# Patient Record
Sex: Female | Born: 1975 | Hispanic: Yes | Marital: Married | Smoking: Former smoker
Health system: Southern US, Community
[De-identification: ages and names within clinical notes are randomized; demographics above are authoritative.]

---

## 2006-05-10 ENCOUNTER — Encounter: Admission: RE | Admit: 2006-05-10 | Discharge: 2006-05-10 | Payer: Self-pay | Admitting: Family Medicine

## 2007-02-08 ENCOUNTER — Encounter: Admission: RE | Admit: 2007-02-08 | Discharge: 2007-02-08 | Payer: Self-pay | Admitting: Family Medicine

## 2010-03-23 ENCOUNTER — Ambulatory Visit (HOSPITAL_COMMUNITY)
Admission: RE | Admit: 2010-03-23 | Discharge: 2010-03-23 | Payer: Self-pay | Source: Home / Self Care | Admitting: Obstetrics and Gynecology

## 2010-09-25 ENCOUNTER — Encounter: Payer: Self-pay | Admitting: Family Medicine

## 2011-10-18 ENCOUNTER — Other Ambulatory Visit (HOSPITAL_COMMUNITY): Payer: Self-pay | Admitting: Obstetrics and Gynecology

## 2011-10-18 DIAGNOSIS — N736 Female pelvic peritoneal adhesions (postinfective): Secondary | ICD-10-CM

## 2011-10-24 ENCOUNTER — Ambulatory Visit (HOSPITAL_COMMUNITY)
Admission: RE | Admit: 2011-10-24 | Discharge: 2011-10-24 | Disposition: A | Discharge status: Home / Self Care | Payer: PRIVATE HEALTH INSURANCE | Source: Ambulatory Visit | Attending: Obstetrics and Gynecology | Admitting: Obstetrics and Gynecology

## 2011-10-24 DIAGNOSIS — N736 Female pelvic peritoneal adhesions (postinfective): Secondary | ICD-10-CM

## 2011-10-24 DIAGNOSIS — N979 Female infertility, unspecified: Secondary | ICD-10-CM | POA: Insufficient documentation

## 2011-10-24 MED ORDER — IOHEXOL 300 MG/ML  SOLN
12.0000 mL | Freq: Once | INTRAMUSCULAR | Status: AC | PRN
  Administered 2011-10-24: 12 mL

## 2012-11-01 IMAGING — RF DG HYSTEROGRAM
3 series · 6 of 6 positions shown · non-contrast
Comparison: none

CLINICAL DATA: Infertility, status post laparoscopic lysis of
adhesions

HYSTEROSALPINGOGRAM
TECHNIQUE: Hysterosalpingogram was performed by the ordering
physician under fluoroscopy.  Fluoroscopic images are submitted for
interpretation following the procedure.
Fluoroscopy Time:  0.7 minutes.

[Series 1: run · 2 of 2 slices shown (1 of 3)]
[im 1/2]
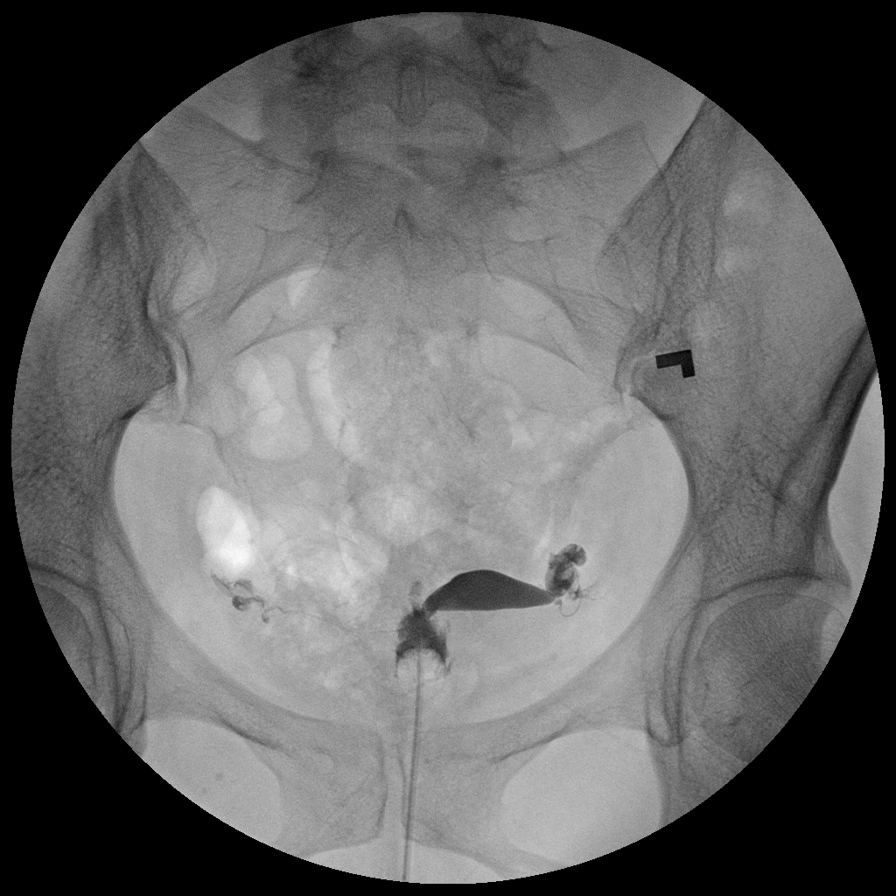
[im 2/2]
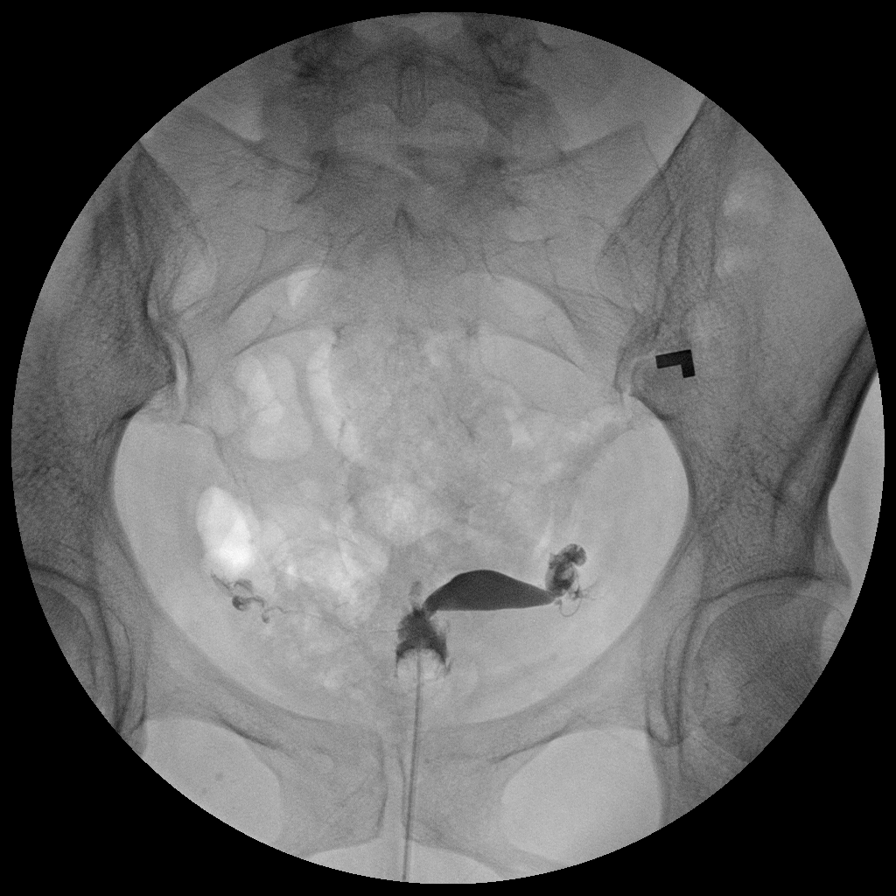

[Series 2: run · 2 of 2 slices shown (2 of 3)]
[im 1/2]
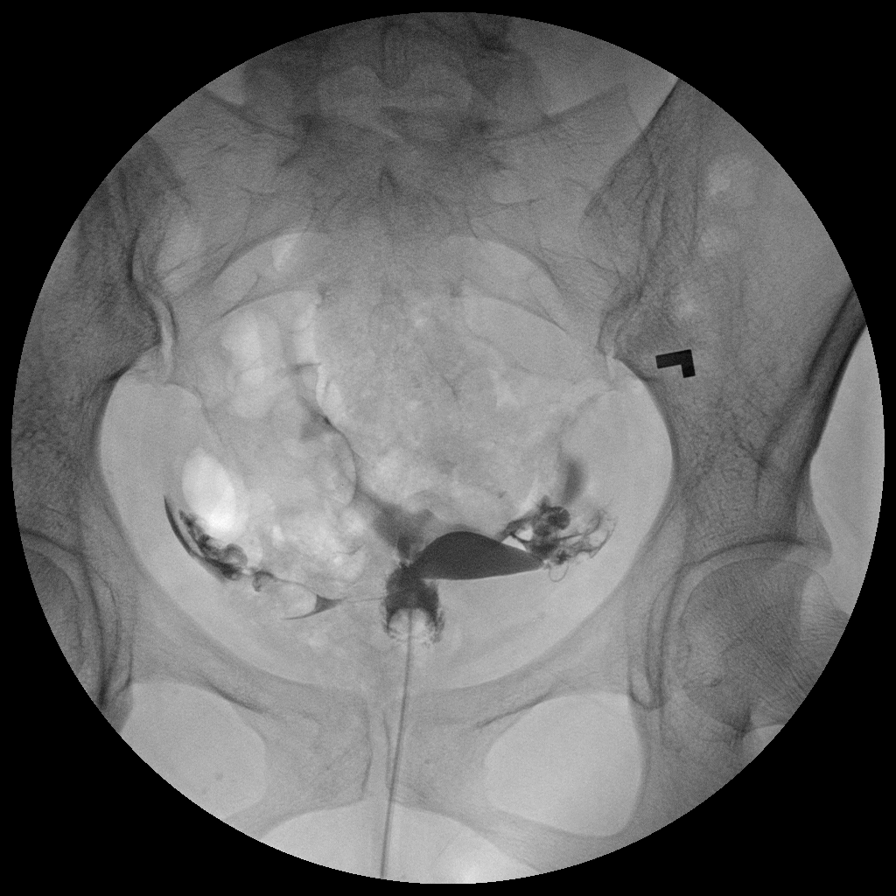
[im 2/2]
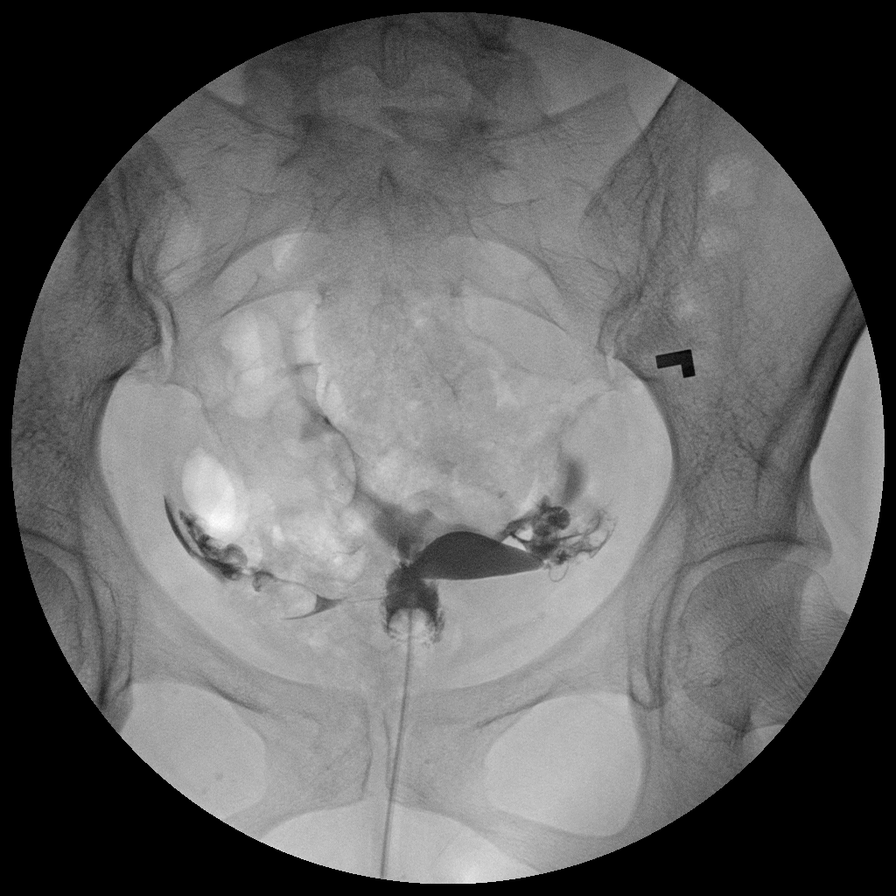

[Series 3: run · 2 of 2 slices shown (3 of 3)]
[im 1/2]
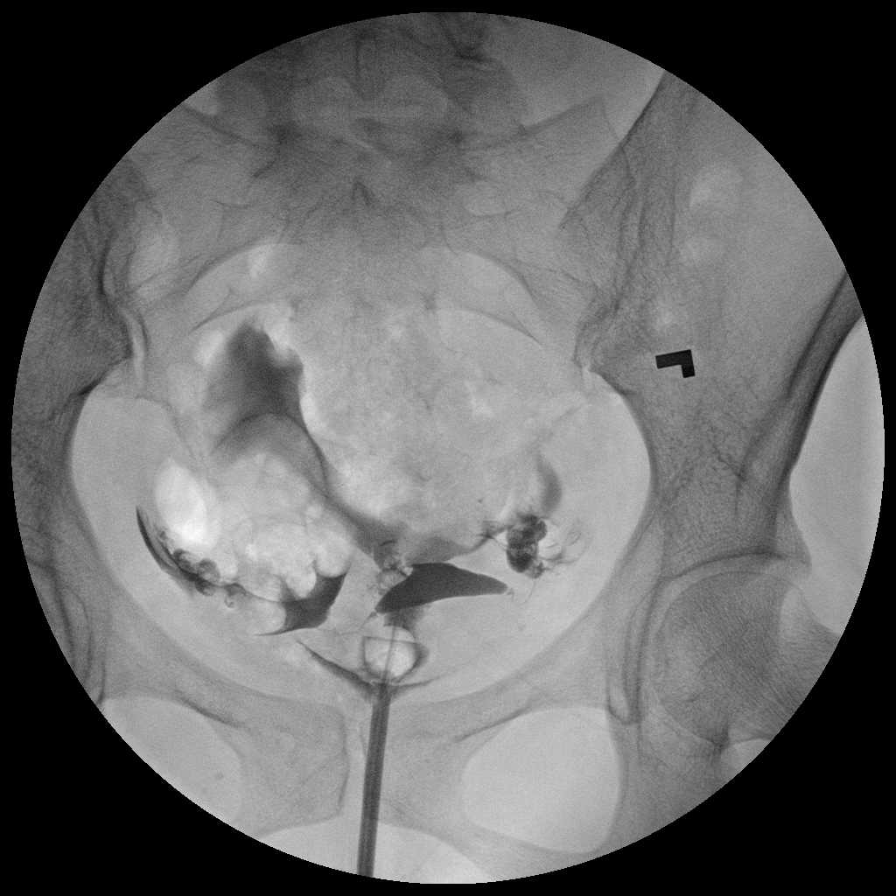
[im 2/2]
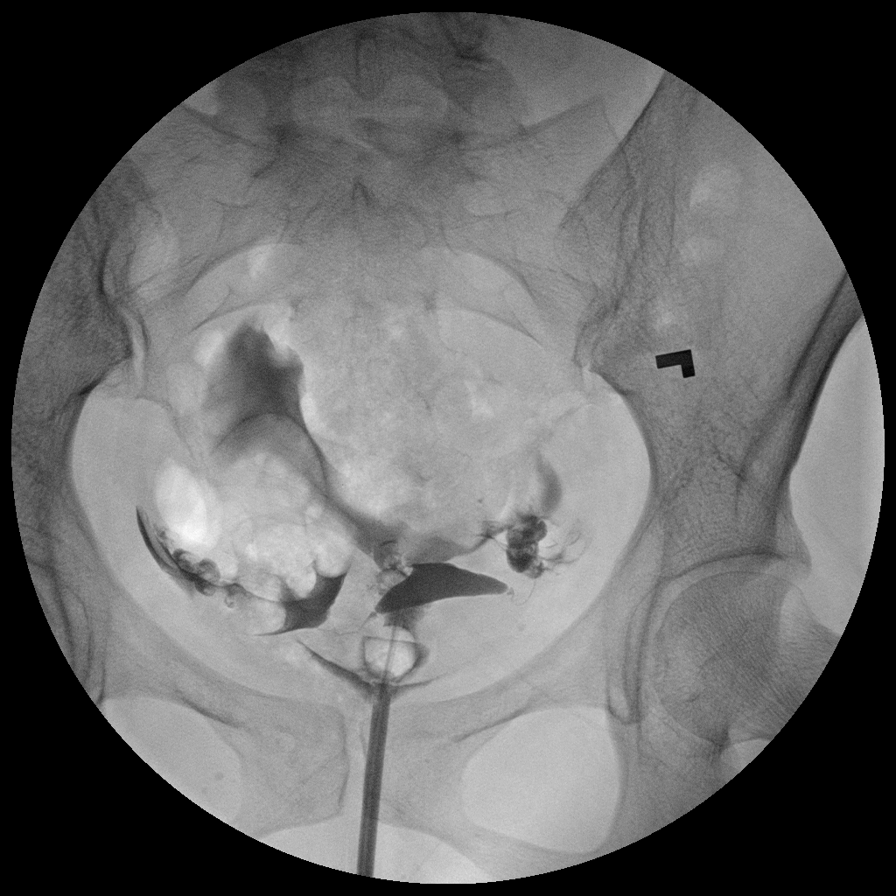

[6 of 6 positions shown; findings below may reference images not displayed]

FINDINGS: The endometrial cavity of the uterus is normal in contour
and appearance.

Contrast filling of both fallopian tubes is seen, and both tubes
are normal in appearance.  Intraperitoneal spill of the contrast
from both fallopian tubes is demonstrated.
IMPRESSION: Normal study.  Fallopian tubes are patent bilaterally.

## 2016-08-26 ENCOUNTER — Inpatient Hospital Stay (HOSPITAL_COMMUNITY): Payer: PRIVATE HEALTH INSURANCE

## 2016-08-26 ENCOUNTER — Encounter (HOSPITAL_COMMUNITY): Payer: Self-pay

## 2016-08-26 ENCOUNTER — Inpatient Hospital Stay (HOSPITAL_COMMUNITY)
Admission: AD | Admit: 2016-08-26 | Discharge: 2016-08-26 | Disposition: A | Discharge status: Home / Self Care | Payer: PRIVATE HEALTH INSURANCE | Source: Ambulatory Visit | Attending: Obstetrics and Gynecology | Admitting: Obstetrics and Gynecology

## 2016-08-26 DIAGNOSIS — Z7982 Long term (current) use of aspirin: Secondary | ICD-10-CM | POA: Diagnosis not present

## 2016-08-26 DIAGNOSIS — R109 Unspecified abdominal pain: Secondary | ICD-10-CM

## 2016-08-26 DIAGNOSIS — O021 Missed abortion: Secondary | ICD-10-CM | POA: Diagnosis not present

## 2016-08-26 DIAGNOSIS — Z3A01 Less than 8 weeks gestation of pregnancy: Secondary | ICD-10-CM | POA: Insufficient documentation

## 2016-08-26 DIAGNOSIS — D6861 Antiphospholipid syndrome: Secondary | ICD-10-CM | POA: Diagnosis not present

## 2016-08-26 DIAGNOSIS — O26899 Other specified pregnancy related conditions, unspecified trimester: Secondary | ICD-10-CM

## 2016-08-26 DIAGNOSIS — O99111 Other diseases of the blood and blood-forming organs and certain disorders involving the immune mechanism complicating pregnancy, first trimester: Secondary | ICD-10-CM | POA: Insufficient documentation

## 2016-08-26 DIAGNOSIS — R1031 Right lower quadrant pain: Secondary | ICD-10-CM | POA: Diagnosis present

## 2016-08-26 DIAGNOSIS — Z87891 Personal history of nicotine dependence: Secondary | ICD-10-CM | POA: Insufficient documentation

## 2016-08-26 DIAGNOSIS — Z7901 Long term (current) use of anticoagulants: Secondary | ICD-10-CM | POA: Insufficient documentation

## 2016-08-26 LAB — URINALYSIS, ROUTINE W REFLEX MICROSCOPIC
BILIRUBIN URINE: NEGATIVE
Bacteria, UA: NONE SEEN
Glucose, UA: NEGATIVE mg/dL
HGB URINE DIPSTICK: NEGATIVE
Ketones, ur: NEGATIVE mg/dL
Nitrite: NEGATIVE
PROTEIN: NEGATIVE mg/dL
Specific Gravity, Urine: 1.011 (ref 1.005–1.030)
pH: 6 (ref 5.0–8.0)

## 2016-08-26 LAB — CBC
HCT: 37.3 % (ref 36.0–46.0)
HEMOGLOBIN: 13.1 g/dL (ref 12.0–15.0)
MCH: 30.4 pg (ref 26.0–34.0)
MCHC: 35.1 g/dL (ref 30.0–36.0)
MCV: 86.5 fL (ref 78.0–100.0)
PLATELETS: 251 10*3/uL (ref 150–400)
RBC: 4.31 MIL/uL (ref 3.87–5.11)
RDW: 12.5 % (ref 11.5–15.5)
WBC: 9.7 10*3/uL (ref 4.0–10.5)

## 2016-08-26 LAB — POCT PREGNANCY, URINE: PREG TEST UR: POSITIVE — AB

## 2016-08-26 LAB — HCG, QUANTITATIVE, PREGNANCY: HCG, BETA CHAIN, QUANT, S: 59404 m[IU]/mL — AB (ref <5)

## 2016-08-26 LAB — ABO/RH: ABO/RH(D): A POS

## 2016-08-26 MED ORDER — OXYCODONE-ACETAMINOPHEN 5-325 MG PO TABS: 1.0000 | ORAL_TABLET | ORAL | 0 refills | Status: AC | PRN

## 2016-08-26 MED ORDER — PROMETHAZINE HCL 12.5 MG PO TABS: 12.5000 mg | ORAL_TABLET | ORAL | 0 refills | Status: AC | PRN

## 2016-08-26 MED ORDER — MISOPROSTOL 200 MCG PO TABS: 800.0000 ug | ORAL_TABLET | Freq: Once | ORAL | 1 refills | Status: AC

## 2016-08-26 MED ORDER — IBUPROFEN 600 MG PO TABS: 600.0000 mg | ORAL_TABLET | Freq: Four times a day (QID) | ORAL | 0 refills | Status: AC | PRN

## 2016-08-26 NOTE — MAU Note (Addendum)
Pt having lower abdominal pain 3/10. Started today. No bleeding or LOF. Pregnancy was confirmed in [place] by ultrasound. Pt has appointment in [place] in  2 weeks and is flying back soon.

## 2016-08-26 NOTE — MAU Provider Note (Signed)
Chief Complaint: Abdominal Pain   SUBJECTIVE HPI: Traci Curtis is a 41 y.o. [id number] at [redacted]w[redacted]d who presents to Maternity Admissions reporting abdominal cramping.  Patient is coming in reporting some abdominal cramping in the right lower quadrant. She states this cramping began this morning. It is not present on admission today. Patient is very concerned that she might be having another miscarriage. She denies any vaginal bleeding or leaking of fluid or abnormal discharge. She found out she was pregnant when she was in [place] before arriving in the [place]. Here visiting family and is planning on traveling back to [place] in 9 days. 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 Taking vitamin D and prenatal vitamins as well. She has had 2 miscarriages previously.  Patient brought in documents from her OB in [place] (in [place], interpretor was able to translate for MD, shows a 5 week 2 day IUP, did not mention cardiac activity). 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 Denies any history of blood clots or pulmonary embolisms.   History reviewed. No pertinent past medical history. OB History  Gravida Para Term Preterm AB Living  4       2 1   SAB TAB Ectopic Multiple Live Births  2            # Outcome Date GA Lbr Len/2nd Weight Sex Delivery Anes PTL Lv  4 Current           3 Gravida           2 SAB           1 SAB              History reviewed. No pertinent surgical history. Social History   Social History  . Marital status: Married    Spouse name: N/A  . Number of children: N/A  . Years of education: N/A   Occupational History  . Not on file.   Social History Main Topics  .  Smoking status: Former Smoker    Quit date: 08/26/2004  . Smokeless tobacco: Never Used  . Alcohol use Not on file  . Drug use: Unknown  . Sexual activity: Not on file   Other Topics Concern  . Not on file   Social History Narrative  . No narrative on file   No current facility-administered medications on file prior to encounter.    No current outpatient prescriptions on file prior to encounter.   No Known Allergies  I have reviewed the past Medical Hx, Surgical Hx, Social Hx, Allergies and Medications.   REVIEW OF SYSTEMS  A comprehensive ROS was negative except per HPI.    OBJECTIVE Patient Vitals for the past 24 hrs:  BP Temp Pulse Resp Height Weight  08/26/16 1836 133/64 - 92 - - -  08/26/16 1831 148/71 98 F (36.7 C) 98 18 - -  08/26/16 1825 - - - - 5\' 6"  (1.676 m) 161 lb 6.4 oz (73.2 kg)    PHYSICAL EXAM Constitutional: Well-developed, well-nourished female in no acute distress.  Cardiovascular: normal rate, rhythm, no murmurs Respiratory: normal rate and effort. CTBA GI: Abd soft, non-tender, non-distended.  Pos BS x 4 MS: Extremities nontender, no edema, normal ROM Neurologic: Alert and oriented x 4.  GU: Neg CVAT.   LAB RESULTS Results for orders placed or performed during the hospital encounter of 08/26/16 (from the past 24 hour(s))  Urinalysis, Routine w reflex microscopic     Status: Abnormal   Collection Time: 08/26/16  6:21 PM  Result Value Ref Range   Color, Urine YELLOW YELLOW   APPearance CLEAR CLEAR   Specific Gravity, Urine 1.011 1.005 - 1.030   pH 6.0 5.0 - 8.0   Glucose, UA NEGATIVE NEGATIVE mg/dL   Hgb urine dipstick NEGATIVE NEGATIVE   Bilirubin Urine NEGATIVE NEGATIVE   Ketones, ur NEGATIVE NEGATIVE mg/dL   Protein, ur NEGATIVE NEGATIVE mg/dL   Nitrite NEGATIVE NEGATIVE   Leukocytes, UA TRACE (A) NEGATIVE   RBC / HPF 0-5 0 - 5 RBC/hpf   WBC, UA 0-5 0 - 5 WBC/hpf   Bacteria, UA NONE SEEN NONE SEEN   Squamous Epithelial / LPF 0-5 (A)  NONE SEEN  Pregnancy, urine POC     Status: Abnormal   Collection Time: 08/26/16  6:41 PM  Result Value Ref Range   Preg Test, Ur POSITIVE (A) NEGATIVE  CBC     Status: None   Collection Time: 08/26/16  8:31 PM  Result Value Ref Range   WBC 9.7 4.0 - 10.5 K/uL   RBC 4.31 3.87 - 5.11 MIL/uL   Hemoglobin 13.1 12.0 - 15.0 g/dL   HCT 16.1 09.6 - 04.5 %   MCV 86.5 78.0 - 100.0 fL   MCH 30.4 26.0 - 34.0 pg   MCHC 35.1 30.0 - 36.0 g/dL   RDW 40.9 81.1 - 91.4 %   Platelets 251 150 - 400 K/uL    IMAGING US Ob Comp Less 14 Wks  Result Date: 08/26/2016 CLINICAL DATA:  Abdominal cramping affecting first-trimester pregnancy. EXAM: OBSTETRIC <14 WK [place] AND TRANSVAGINAL OB US TECHNIQUE: Both transabdominal and transvaginal ultrasound examinations were performed for complete evaluation of the gestation as well as the maternal uterus, adnexal regions, and pelvic cul-de-sac. Transvaginal technique was performed to assess early pregnancy. COMPARISON:  None. FINDINGS: Intrauterine gestational sac:  Single and irregular shaped Yolk sac:  Involuting Embryo:  Visualized. Cardiac Activity: Not Visualized. Heart Rate: 0  bpm CRL: 10 point for mm 7 w 1 d [place] EDC: 04/13/2017 Subchorionic hemorrhage:  None visualized. Maternal uterus/adnexae: Corpus luteum on the right measuring 1.4 x 1.1 x 1.3 cm. The left ovary was not visualized. There is no free fluid. IMPRESSION: Seven week 1 day intrauterine gestation without cardiac activity. Findings meet definitive criteria for failed pregnancy. This follows SRU consensus guidelines: Diagnostic Criteria for Nonviable Pregnancy Early in the First Trimester. [facility] Mis J Med [phone]. Electronically Signed   By: Tollie Eth M.D.   On: 08/26/2016 19:54   US Ob Transvaginal  Result Date: 08/26/2016 CLINICAL DATA:  Abdominal cramping affecting first-trimester pregnancy. EXAM: OBSTETRIC <14 WK [place] AND TRANSVAGINAL OB US TECHNIQUE: Both transabdominal and transvaginal ultrasound  examinations were performed for complete evaluation of the gestation as well as the maternal uterus, adnexal regions, and pelvic cul-de-sac. Transvaginal technique was performed to assess early pregnancy. COMPARISON:  None. FINDINGS: Intrauterine gestational sac:  Single and irregular shaped Yolk sac:  Involuting Embryo:  Visualized. Cardiac Activity: Not Visualized. Heart Rate: 0  bpm CRL: 10 point for mm 7 w 1 d [place] EDC: 04/13/2017 Subchorionic hemorrhage:  None visualized. Maternal uterus/adnexae: Corpus luteum on the  right measuring 1.4 x 1.1 x 1.3 cm. The left ovary was not visualized. There is no free fluid. IMPRESSION: Seven week 1 day intrauterine gestation without cardiac activity. Findings meet definitive criteria for failed pregnancy. This follows SRU consensus guidelines: Diagnostic Criteria for Nonviable Pregnancy Early in the First Trimester. Macy Mis Engl J Med [phone]. Electronically Signed   By: Tollie Ethavid  Kwon M.D.   On: 08/26/2016 19:54    MAU COURSE TVUS - Missed AB CBC/ABO/BHCG  MDM Plan of care reviewed with patient, including labs and tests ordered and medical treatment. Had a long discussion with the patient and her husband in the room regarding treatment plans for her missed abortion. Discussion was had regarding expectant management, medication induced or procedural management. Patient does not want a procedure performed. She has elected to have medication induced management.  Patient is leaving in 2 days to travel to [place], [place] to visit friends with her family. She states that her friend has recently had a baby and has an OB/GYN there that she is able to see if needed. Discussed with patient that it would be recommended to take the Cytotec on Monday after she arrives in [place]. She will be given pain medication, nausea medicine to take as needed. It was recommended that she have an OB follow-up on Thursday or Friday before she travels back to [place] Sunday. She is to resume  heparin on Saturday before she travels back to [place] as long as she is no longer having any significant bleeding. Signs and symptoms were discussed to have immediate ER follow-up, and that includes but not limited to: Significant heavy bleeding that is more than a pad an hour, fevers, abdominal tenderness. Patient and her husband were deemed reliable for this process. It was also recommended that she stop her heparin treatments today to prevent any significant heavy bleeding with this process. VERBAL CONSENT WAS OBTAINED.  Plan was discussed with Dr. Emelda FearFerguson, on-call physician, who agrees with the plan above.  ASSESSMENT 1. Missed abortion   2. Abdominal cramping affecting pregnancy     PLAN Discharge home in stable condition. Begin cytotec treatment on Monday, no earlier, and STOP heparin treatment today. Resume heparin treatment on Saturday prior to departure for [place] unless she is having heavy bleeding. Plan discussed per above.     Early Intrauterine Pregnancy Failure  _X_  Documented intrauterine pregnancy failure less than or equal to [redacted] weeks gestation  _X_  No serious current illness  _X_  Baseline Hgb greater than or equal to 10g/dl  _X_  Patient has easily accessible transportation to the hospital  _X_  Clear preference  _X_  Practitioner/physician deems patient reliable  _X_  Counseling by practitioner or physician  ___  Patient education by RN  ___  Consent form signed  ___  Rho-Gam given by RN if indicated  ___ Medication dispensed   _X_   Cytotec 800 mcg           _X_   Intravaginally by patient at home         __   Intravaginally by RN in MAU        __   Rectally by patient at home        __   Rectally by RN in MAU  _X_  Ibuprofen 600 mg 1 tablet by mouth every 6 hours as needed #30  _X_  Hydrocodone/acetaminophen 5/325 mg by mouth every 4 to 6 hours as needed  _X_  Phenergan 12.5 mg  by mouth every 4 hours as needed for  nausea       Follow-up Information    OB provider of choice. Schedule an appointment as soon as possible for a visit in 1 week(s).   Why:  Follow up miscarriage         Allergies as of 08/26/2016   No Known Allergies     Medication List    STOP taking these medications   PRESCRIPTION MEDICATION     TAKE these medications   aspirin EC 81 MG tablet Take 81 mg by mouth daily.   ibuprofen 600 MG tablet Commonly known as:  ADVIL,MOTRIN Take 1 tablet (600 mg total) by mouth every 6 (six) hours as needed.   misoprostol 200 MCG tablet Commonly known as:  CYTOTEC Place 4 tablets (800 mcg total) vaginally once. Repeat in 24 hours if bleeding does not occur.   oxyCODONE-acetaminophen 5-325 MG tablet Commonly known as:  ROXICET Take 1-2 tablets by mouth every 4 (four) hours as needed for severe pain.   prenatal multivitamin Tabs tablet Take 1 tablet by mouth daily at 12 noon.   PRESCRIPTION MEDICATION Take 75 mcg by mouth every morning. Eutirox filled in [place]   progesterone 200 MG capsule Commonly known as:  PROMETRIUM Take 200 mg by mouth daily.   promethazine 12.5 MG tablet Commonly known as:  PHENERGAN Take 1 tablet (12.5 mg total) by mouth every 4 (four) hours as needed for nausea or vomiting.        Jen Mow, DO OB Fellow 08/26/2016 9:03 PM

## 2016-08-26 NOTE — Discharge Instructions (Signed)

## 2017-06-26 ENCOUNTER — Encounter (HOSPITAL_COMMUNITY): Payer: Self-pay

## 2017-09-04 IMAGING — US US OB COMP LESS 14 WK
1 series · 15 of 28 positions shown · non-contrast
Comparison: None.

CLINICAL DATA: Abdominal cramping affecting first-trimester
pregnancy.

EXAM:
OBSTETRIC <14 WK US AND TRANSVAGINAL OB US
TECHNIQUE: Both transabdominal and transvaginal ultrasound examinations were
performed for complete evaluation of the gestation as well as the
maternal uterus, adnexal regions, and pelvic cul-de-sac.
Transvaginal technique was performed to assess early pregnancy.

[Series 1: us ob comp less 14 wk · 15 of 63 slices shown]
[im 1/63]
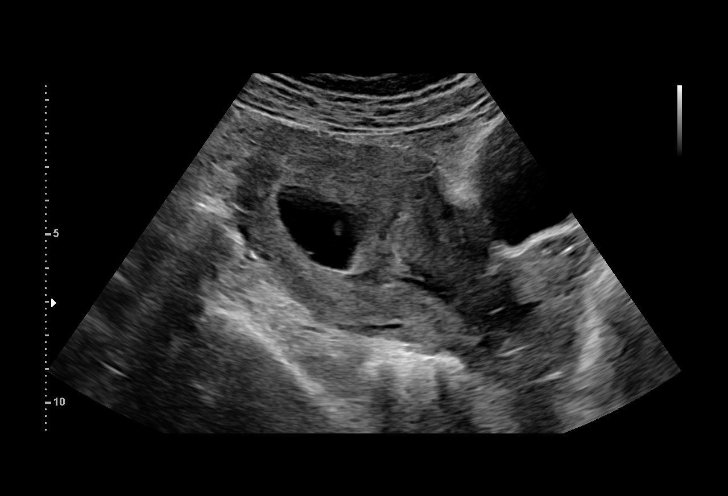
[im 5/63]
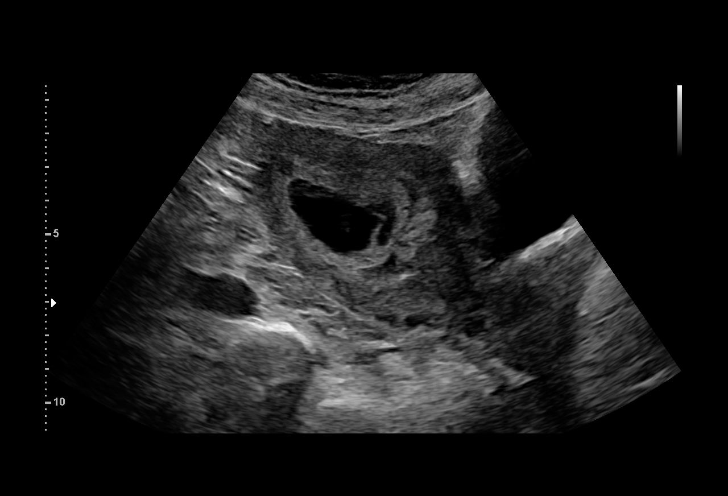
[im 10/63]
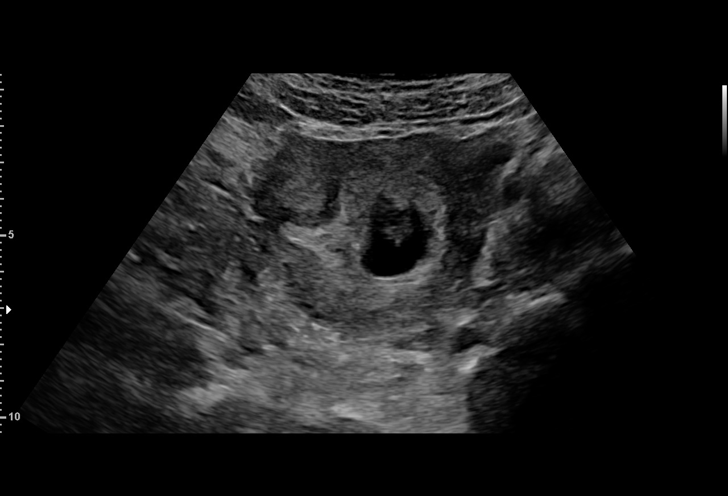
[im 14/63]
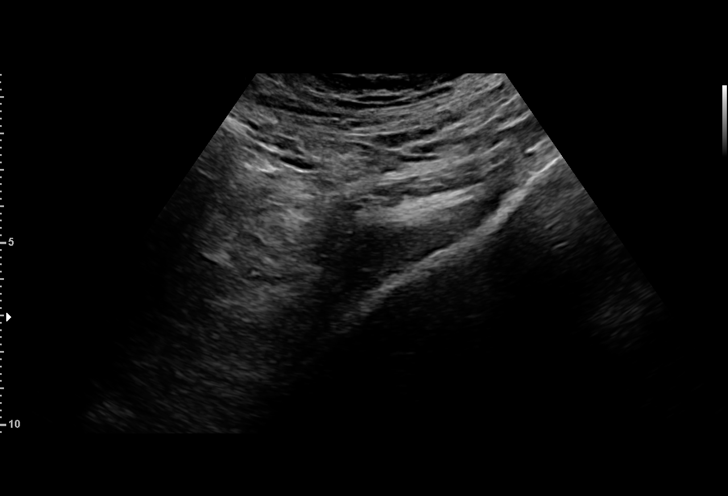
[im 19/63]
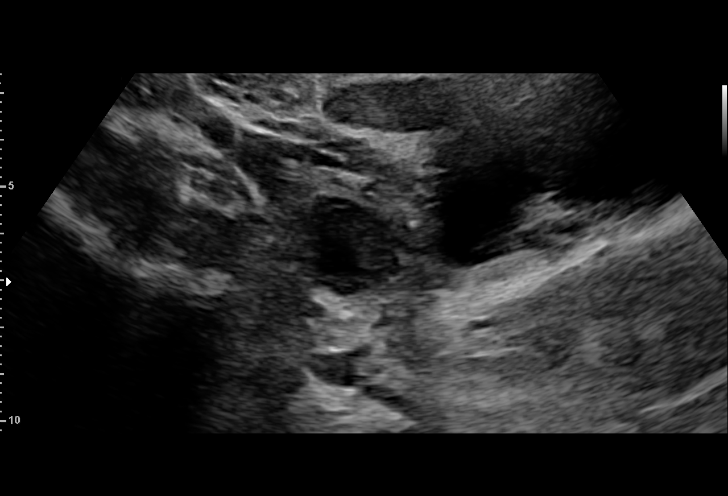
[im 23/63]
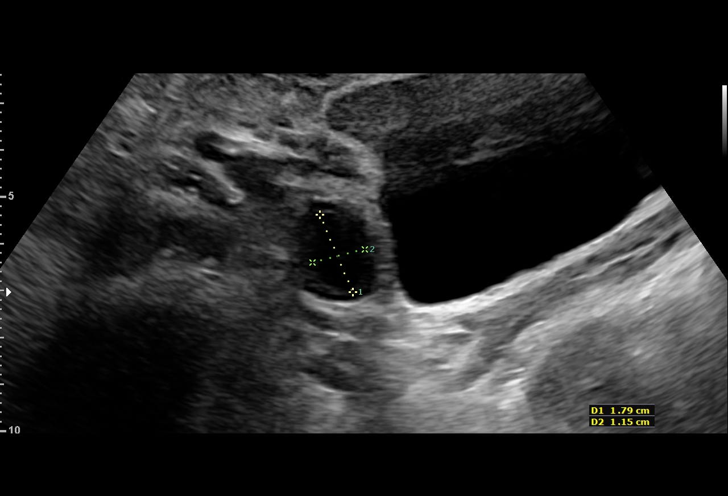
[im 28/63]
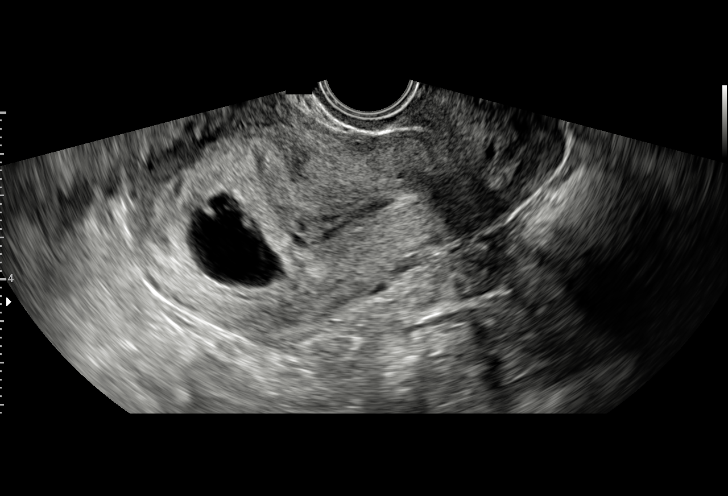
[im 33/63]
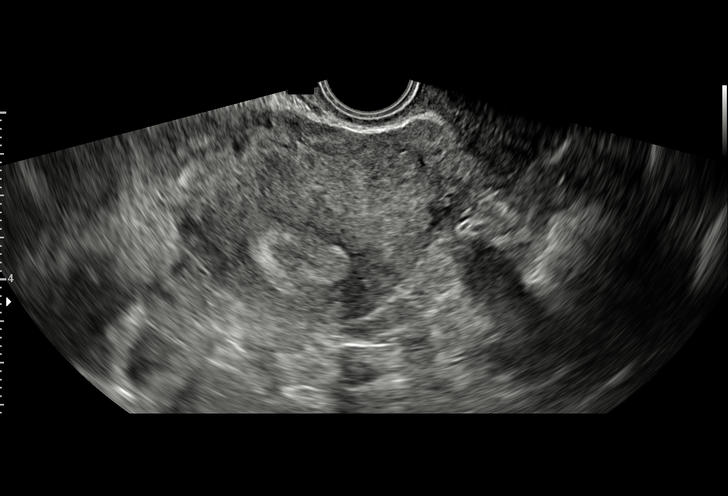
[im 35/63]
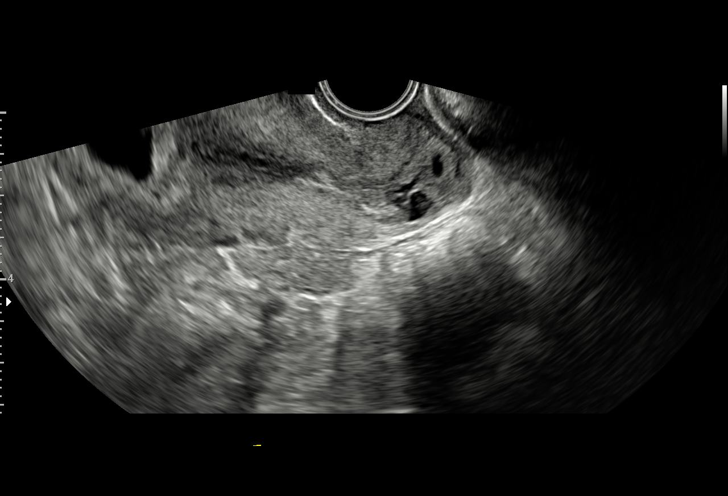
[im 40/63]
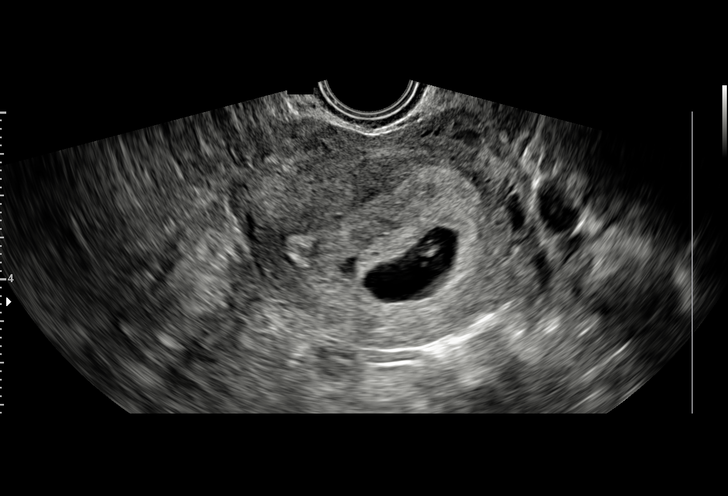
[im 44/63]
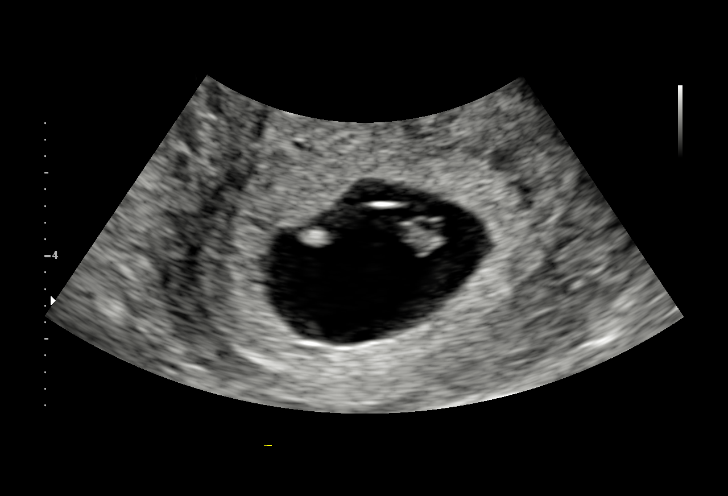
[im 49/63]
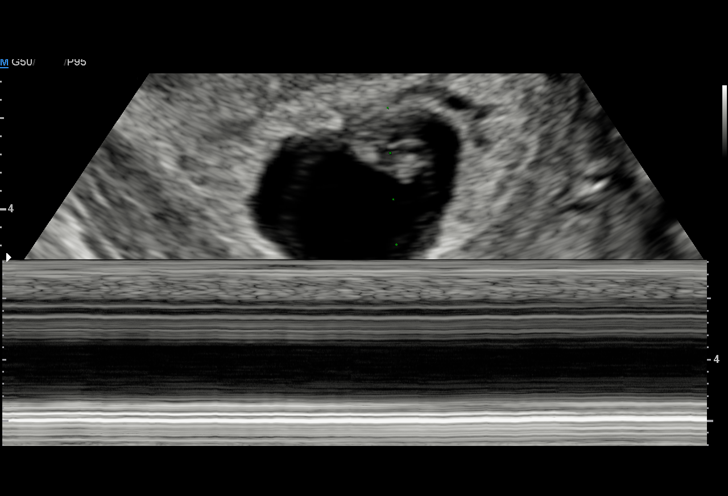
[im 53/63]
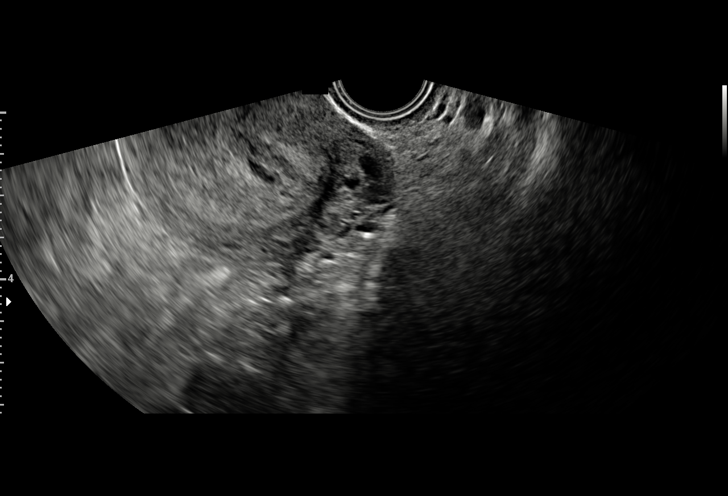
[im 58/63]
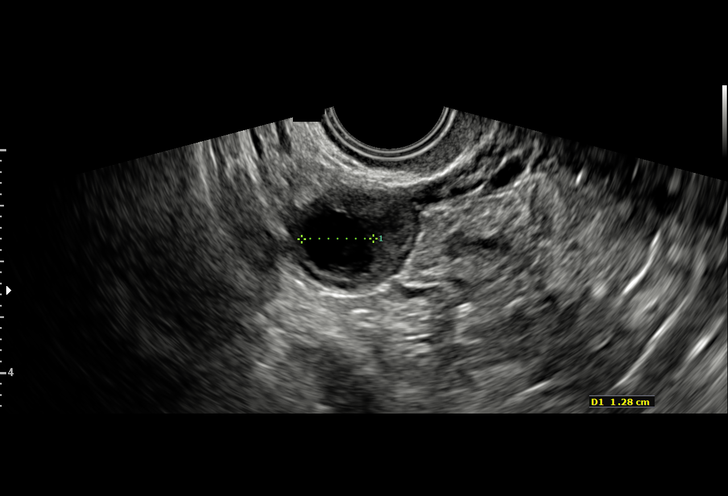
[im 63/63]
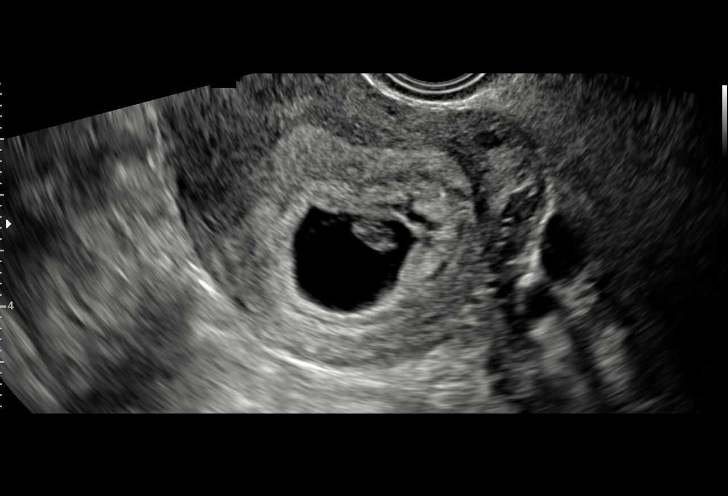

[15 of 28 positions shown; findings below may reference images not displayed]

FINDINGS: Intrauterine gestational sac:  Single and irregular shaped

Yolk sac:  Involuting

Embryo:  Visualized.

Cardiac Activity: Not Visualized.

Heart Rate: 0  bpm

CRL: 10 point for mm 7 w 1 d US EDC: 04/13/2017

Subchorionic hemorrhage:  None visualized.

Maternal uterus/adnexae: Corpus luteum on the right measuring 1.4 x
1.1 x 1.3 cm. The left ovary was not visualized. There is no free
fluid.
IMPRESSION: Seven week 1 day intrauterine gestation without cardiac activity.
Findings meet definitive criteria for failed pregnancy. This follows
SRU consensus guidelines: Diagnostic Criteria for Nonviable
Pregnancy Early in the First Trimester. N Engl J Med
